# Patient Record
Sex: Female | Born: 1965 | Race: White | Hispanic: No | Marital: Married
Health system: Southern US, Community
[De-identification: ages and names within clinical notes are randomized; demographics above are authoritative.]

---

## 2020-07-03 ENCOUNTER — Other Ambulatory Visit: Payer: Self-pay

## 2020-07-03 ENCOUNTER — Emergency Department (HOSPITAL_COMMUNITY)
Admission: EM | Admit: 2020-07-03 | Discharge: 2020-07-03 | Disposition: A | Discharge status: Home / Self Care | Payer: Managed Care, Other (non HMO) | Attending: Emergency Medicine | Admitting: Emergency Medicine

## 2020-07-03 ENCOUNTER — Encounter (HOSPITAL_COMMUNITY): Payer: Self-pay | Admitting: Emergency Medicine

## 2020-07-03 ENCOUNTER — Emergency Department (HOSPITAL_COMMUNITY): Payer: Managed Care, Other (non HMO)

## 2020-07-03 DIAGNOSIS — R1110 Vomiting, unspecified: Secondary | ICD-10-CM | POA: Insufficient documentation

## 2020-07-03 DIAGNOSIS — R109 Unspecified abdominal pain: Secondary | ICD-10-CM | POA: Insufficient documentation

## 2020-07-03 DIAGNOSIS — N200 Calculus of kidney: Secondary | ICD-10-CM

## 2020-07-03 LAB — CBC
HCT: 35.8 % — ABNORMAL LOW (ref 36.0–46.0)
Hemoglobin: 11.9 g/dL — ABNORMAL LOW (ref 12.0–15.0)
MCH: 30.3 pg (ref 26.0–34.0)
MCHC: 33.2 g/dL (ref 30.0–36.0)
MCV: 91.1 fL (ref 80.0–100.0)
Platelets: 292 10*3/uL (ref 150–400)
RBC: 3.93 MIL/uL (ref 3.87–5.11)
RDW: 12.5 % (ref 11.5–15.5)
WBC: 9.8 10*3/uL (ref 4.0–10.5)
nRBC: 0 % (ref 0.0–0.2)

## 2020-07-03 LAB — URINALYSIS, ROUTINE W REFLEX MICROSCOPIC
Bacteria, UA: NONE SEEN
Bilirubin Urine: NEGATIVE
Glucose, UA: 50 mg/dL — AB
Ketones, ur: 20 mg/dL — AB
Leukocytes,Ua: NEGATIVE
Nitrite: NEGATIVE
Protein, ur: NEGATIVE mg/dL
RBC / HPF: >50 RBC/hpf — ABNORMAL HIGH (ref 0–5)
Specific Gravity, Urine: 1.013 (ref 1.005–1.030)
pH: 8 (ref 5.0–8.0)

## 2020-07-03 LAB — COMPREHENSIVE METABOLIC PANEL
ALT: 18 U/L (ref 0–44)
AST: 22 U/L (ref 15–41)
Albumin: 4.7 g/dL (ref 3.5–5.0)
Alkaline Phosphatase: 69 U/L (ref 38–126)
Anion gap: 10 (ref 5–15)
BUN: 20 mg/dL (ref 6–20)
CO2: 26 mmol/L (ref 22–32)
Calcium: 9.7 mg/dL (ref 8.9–10.3)
Chloride: 104 mmol/L (ref 98–111)
Creatinine, Ser: 1.17 mg/dL — ABNORMAL HIGH (ref 0.44–1.00)
GFR, Estimated: 55 mL/min — ABNORMAL LOW (ref >60)
Glucose, Bld: 137 mg/dL — ABNORMAL HIGH (ref 70–99)
Potassium: 3.7 mmol/L (ref 3.5–5.1)
Sodium: 140 mmol/L (ref 135–145)
Total Bilirubin: 0.5 mg/dL (ref 0.3–1.2)
Total Protein: 7.9 g/dL (ref 6.5–8.1)

## 2020-07-03 MED ORDER — SODIUM CHLORIDE 0.9 % IV SOLN: INTRAVENOUS | Status: DC

## 2020-07-03 MED ORDER — OXYCODONE-ACETAMINOPHEN 5-325 MG PO TABS: 1.0000 | ORAL_TABLET | Freq: Four times a day (QID) | ORAL | 0 refills | Status: DC | PRN

## 2020-07-03 MED ORDER — TAMSULOSIN HCL 0.4 MG PO CAPS: 0.4000 mg | ORAL_CAPSULE | Freq: Every day | ORAL | 0 refills | Status: AC

## 2020-07-03 MED ORDER — ONDANSETRON HCL 4 MG/2ML IJ SOLN
4.0000 mg | Freq: Once | INTRAMUSCULAR | Status: AC
  Administered 2020-07-03: 4 mg via INTRAVENOUS
  Filled 2020-07-03: qty 2

## 2020-07-03 MED ORDER — KETOROLAC TROMETHAMINE 30 MG/ML IJ SOLN
15.0000 mg | Freq: Once | INTRAMUSCULAR | Status: AC
  Administered 2020-07-03: 15 mg via INTRAVENOUS
  Filled 2020-07-03: qty 1

## 2020-07-03 MED ORDER — ONDANSETRON 8 MG PO TBDP: 8.0000 mg | ORAL_TABLET | Freq: Three times a day (TID) | ORAL | 0 refills | Status: AC | PRN

## 2020-07-03 MED ORDER — HYDROMORPHONE HCL 1 MG/ML IJ SOLN
1.0000 mg | Freq: Once | INTRAMUSCULAR | Status: AC
  Administered 2020-07-03: 1 mg via INTRAVENOUS
  Filled 2020-07-03: qty 1

## 2020-07-03 MED ORDER — TAMSULOSIN HCL 0.4 MG PO CAPS: 0.4000 mg | ORAL_CAPSULE | Freq: Every day | ORAL | 0 refills | Status: DC

## 2020-07-03 MED ORDER — ONDANSETRON 8 MG PO TBDP: 8.0000 mg | ORAL_TABLET | Freq: Three times a day (TID) | ORAL | 0 refills | Status: DC | PRN

## 2020-07-03 MED ORDER — OXYCODONE-ACETAMINOPHEN 5-325 MG PO TABS: 1.0000 | ORAL_TABLET | Freq: Four times a day (QID) | ORAL | 0 refills | Status: AC | PRN

## 2020-07-03 MED ORDER — OXYCODONE-ACETAMINOPHEN 5-325 MG PO TABS
1.0000 | ORAL_TABLET | Freq: Once | ORAL | Status: AC
  Administered 2020-07-03: 1 via ORAL
  Filled 2020-07-03: qty 1

## 2020-07-03 MED ORDER — ONDANSETRON 4 MG PO TBDP
4.0000 mg | ORAL_TABLET | Freq: Once | ORAL | Status: AC
  Administered 2020-07-03: 4 mg via ORAL
  Filled 2020-07-03: qty 1

## 2020-07-03 NOTE — ED Triage Notes (Signed)
Reports right flank pain with emesis since 0200 today. Hx of kidney stones. Denies dysuria or fevers.

## 2020-07-03 NOTE — ED Provider Notes (Signed)
Emergency Medicine Provider Triage Evaluation Note  Pamela Cannon , a 55 y.o. female  was evaluated in triage.  Pt complains of right-sided flank pain since 2 AM today.  Reports associated nausea and vomiting.  Denies any urinary symptoms or fevers.  Reports history of kidney stones and this feels similar.  No changes to bowel movements.  Review of Systems  Positive: Right-sided flank pain, vomiting Negative: Diarrhea  Physical Exam  BP (!) 161/98 (BP Location: Right Arm)   Pulse (!) 54   Resp 18   SpO2 97%  Gen:   Awake, no distress   Resp:  Normal effort  MSK:   Moves extremities without difficulty  Other:  Right-sided flank tenderness  Medical Decision Making  Medically screening exam initiated at 12:13 PM.  Appropriate orders placed.  Pamela Cannon was informed that the remainder of the evaluation will be completed by another provider, this initial triage assessment does not replace that evaluation, and the importance of remaining in the ED until their evaluation is complete.  Will order labs, urinalysis, CT   Dietrich Pates, PA-C 07/03/20 1214    Gerhard Munch, MD 07/04/20 2339

## 2020-07-03 NOTE — ED Provider Notes (Signed)
[facility]-EMERGENCY DEPT Provider Note   CSN: [id number] Arrival date & time: 07/03/20  1101     History Chief Complaint  Patient presents with  . Flank Pain    Pamela Cannon is a 55 y.o. female.  55 year old female with history kidney stones presents with cute onset of right-sided flank pain which began this morning.  Pain has been colicky.  Feels similar to her renal colic.  Denies any hematuria or dysuria.  No fever or chills.  Has had emesis due to the pain.  Denies any diarrhea.  No treatment use prior to arrival        No past medical history on file.  There are no problems to display for this patient.      OB History   No obstetric history on file.     No family history on file.     Home Medications Prior to Admission medications   Not on File    Allergies    Patient has no allergy information on record.  Review of Systems   Review of Systems  All other systems reviewed and are negative.   Physical Exam Updated Vital Signs BP (!) 181/98   Pulse (!) 54   Resp 16   SpO2 98%   Physical Exam Vitals and nursing note reviewed.  Constitutional:      General: She is not in acute distress.    Appearance: Normal appearance. She is well-developed. She is not toxic-appearing.  HENT:     Head: Normocephalic and atraumatic.  Eyes:     General: Lids are normal.     Conjunctiva/sclera: Conjunctivae normal.     Pupils: Pupils are equal, round, and reactive to light.  Neck:     Thyroid: No thyroid mass.     Trachea: No tracheal deviation.  Cardiovascular:     Rate and Rhythm: Normal rate and regular rhythm.     Heart sounds: Normal heart sounds. No murmur heard. No gallop.   Pulmonary:     Effort: Pulmonary effort is normal. No respiratory distress.     Breath sounds: Normal breath sounds. No stridor. No decreased breath sounds, wheezing, rhonchi or rales.  Abdominal:     General: Bowel sounds are normal. There is no  distension.     Palpations: Abdomen is soft.     Tenderness: There is no abdominal tenderness. There is right CVA tenderness. There is no rebound.  Musculoskeletal:        General: No tenderness. Normal range of motion.     Cervical back: Normal range of motion and neck supple.  Skin:    General: Skin is warm and dry.     Findings: No abrasion or rash.  Neurological:     Mental Status: She is alert and oriented to person, place, and time.     GCS: GCS eye subscore is 4. GCS verbal subscore is 5. GCS motor subscore is 6.     Cranial Nerves: No cranial nerve deficit.     Sensory: No sensory deficit.  Psychiatric:        Speech: Speech normal.        Behavior: Behavior normal.     ED Results / Procedures / Treatments   Labs (all labs ordered are listed, but only abnormal results are displayed) Labs Reviewed  COMPREHENSIVE METABOLIC PANEL - Abnormal; Notable for the following components:      Result Value   Glucose, Bld 137 (*)    Creatinine, Ser  1.17 (*)    GFR, Estimated 55 (*)    All other components within normal limits  CBC - Abnormal; Notable for the following components:   Hemoglobin 11.9 (*)    HCT 35.8 (*)    All other components within normal limits  URINALYSIS, ROUTINE W REFLEX MICROSCOPIC - Abnormal; Notable for the following components:   Color, Urine STRAW (*)    Glucose, UA 50 (*)    Hgb urine dipstick MODERATE (*)    Ketones, ur 20 (*)    RBC / HPF >50 (*)    All other components within normal limits  URINE CULTURE    EKG None  Radiology CT Renal Stone Study  Result Date: 07/03/2020 CLINICAL DATA:  Right flank pain with vomiting EXAM: CT ABDOMEN AND PELVIS WITHOUT CONTRAST TECHNIQUE: Multidetector CT imaging of the abdomen and pelvis was performed following the standard protocol without oral or IV contrast. COMPARISON:  None. FINDINGS: Lower chest: There is slight bibasilar atelectasis. Lung bases otherwise are clear. There is a small hiatal hernia.  Hepatobiliary: No focal liver lesions are appreciable on this noncontrast enhanced study. Gallbladder wall is not appreciably thickened. There is no biliary duct dilatation. Pancreas: There is no pancreatic mass or inflammatory focus. Spleen: No splenic lesions are evident. Adrenals/Urinary Tract: Adrenals bilaterally appear normal. Right kidney is edematous. There is a cyst in the mid right kidney measuring 9 x 9 mm. There is moderately severe hydronephrosis on the right. There is no appreciable hydronephrosis on the left. There is a 2 mm calculus in the mid left kidney. There is a tiny calculus in the mid right kidney. There is right ureteral enlargement to the level of the distal right ureter. There is a calculus in the distal right ureter near the ureterovesical junction measuring 5 x 3 mm. No other ureteral calculi evident. There are several phleboliths in the pelvis near but separate from the distal ureters. Urinary bladder is midline with wall thickness within normal limits. Stomach/Bowel: There is no appreciable bowel wall or mesenteric thickening. There is no appreciable bowel obstruction. Terminal ileum appears unremarkable. No appendiceal region inflammation evident. No free air or portal venous air. Vascular/Lymphatic: No abdominal aortic aneurysm. Minimal calcification noted in portions of the aorta and right common iliac artery. There is no evident adenopathy in the abdomen or pelvis. Reproductive: Uterus is anteverted. No uterine or adnexal masses are appreciable. Other: No appreciable abscess or ascites in the abdomen pelvis. There is slight fat in the umbilicus. 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 No blastic or lytic bone lesions are evident. There is no intramuscular lesion. IMPRESSION: 1. There is a 5 x 3 mm calculus near the ureterovesical junction in the distal right ureter causing moderately severe hydronephrosis and ureterectasis on the  right. Right kidney is edematous. 2.  Small nonobstructing calculi in each kidney. 3. No bowel wall thickening or bowel obstruction. No abscess in the abdomen or pelvis. No appendiceal region inflammation. 4.  Small hiatal hernia. Electronically Signed   By: Bretta Bang III M.D.   On: 07/03/2020 13:31    Procedures Procedures   Medications Ordered in ED Medications  0.9 %  sodium chloride infusion (has no administration in time range)  HYDROmorphone (DILAUDID) injection 1 mg (has no administration in time range)  ondansetron (ZOFRAN) injection 4 mg (has no administration in time range)  ketorolac (TORADOL) 30 MG/ML injection 15 mg (has no administration in time range)  oxyCODONE-acetaminophen (  PERCOCET/ROXICET) 5-325 MG per tablet 1 tablet (1 tablet Oral Given 07/03/20 1237)  ondansetron (ZOFRAN-ODT) disintegrating tablet 4 mg (4 mg Oral Given 07/03/20 1238)    ED Course  I have reviewed the triage vital signs and the nursing notes.  Pertinent labs & imaging results that were available during my care of the patient were reviewed by me and considered in my medical decision making (see chart for details).    MDM Rules/Calculators/A&P                          Patient medicated for pain here.  Renal CT results noted.  Pain is controlled.  Will prescribe Flomax, analgesics. Final Clinical Impression(s) / ED Diagnoses Final diagnoses:  None    Rx / DC Orders ED Discharge Orders    None       Lorre Nick, MD 07/03/20 1535

## 2020-07-04 LAB — URINE CULTURE: Culture: NO GROWTH

## 2022-06-18 IMAGING — CT CT RENAL STONE PROTOCOL
2 of 4 series · 15 of 46 positions shown, 17 images · non-contrast
Comparison: None.

CLINICAL DATA: Right flank pain with vomiting

EXAM:
CT ABDOMEN AND PELVIS WITHOUT CONTRAST
TECHNIQUE: Multidetector CT imaging of the abdomen and pelvis was performed
following the standard protocol without oral or IV contrast.

[Series 2: axial st · axial · 0.80mm/px · z∈[-334,+60]mm · 12 of 89 slices shown, 14 images]
[im 5/89  soft-tissue]
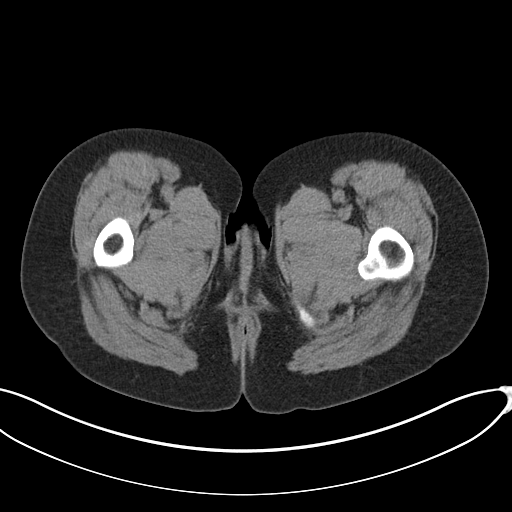
[im 5/89  bone]
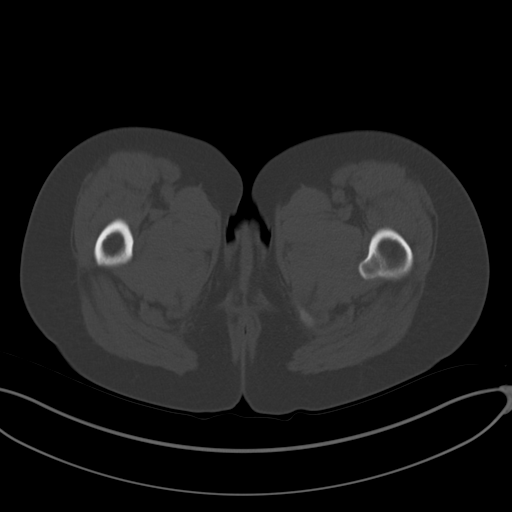
[im 15/89  soft-tissue]
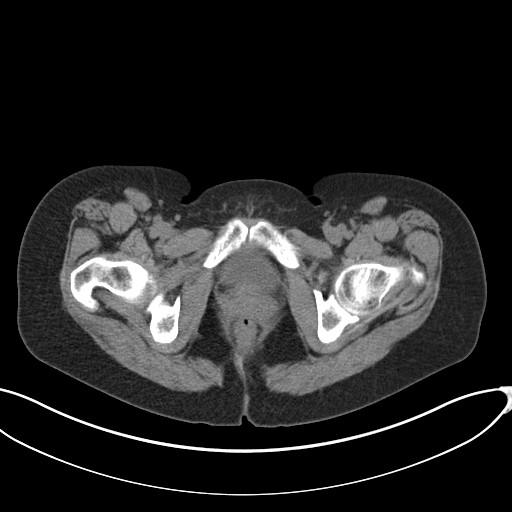
[im 20/89  soft-tissue]
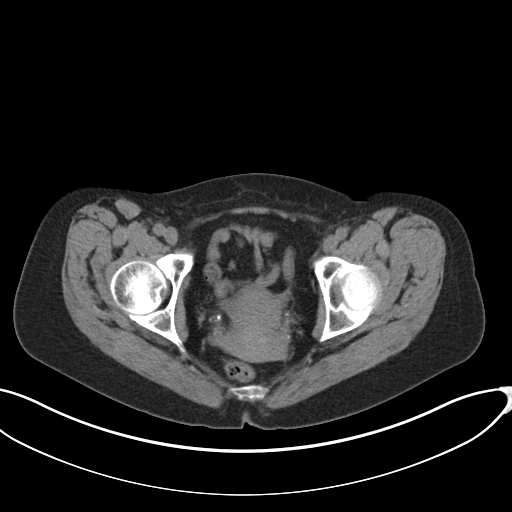
[im 25/89  soft-tissue]
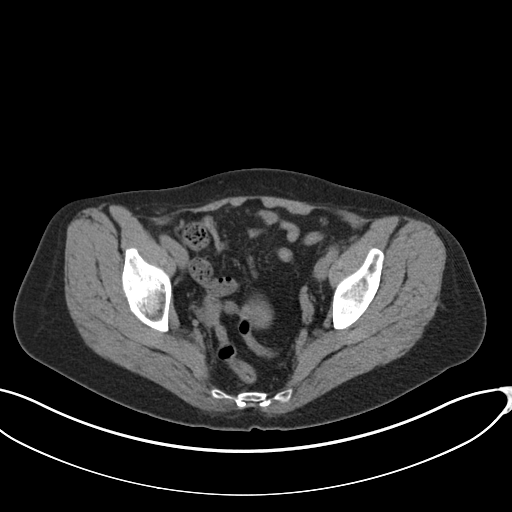
[im 35/89  soft-tissue]
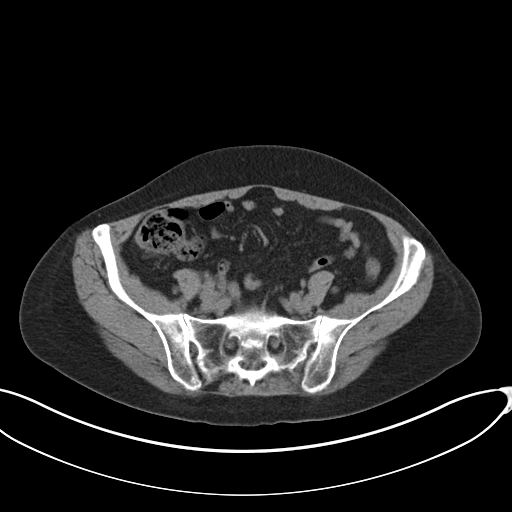
[im 40/89  soft-tissue]
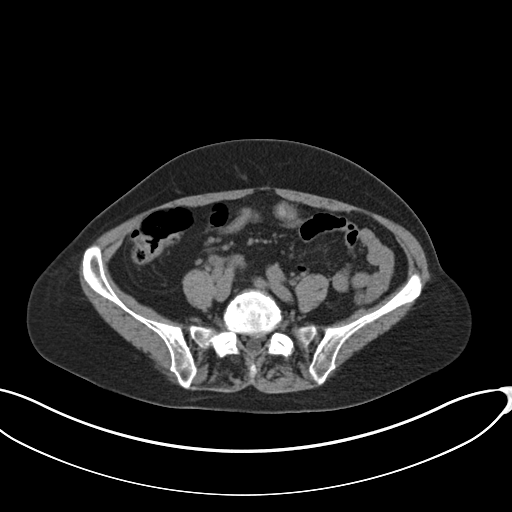
[im 49/89  soft-tissue]
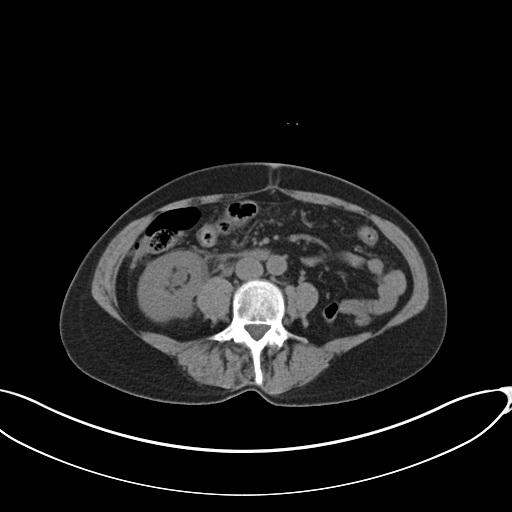
[im 54/89  soft-tissue]
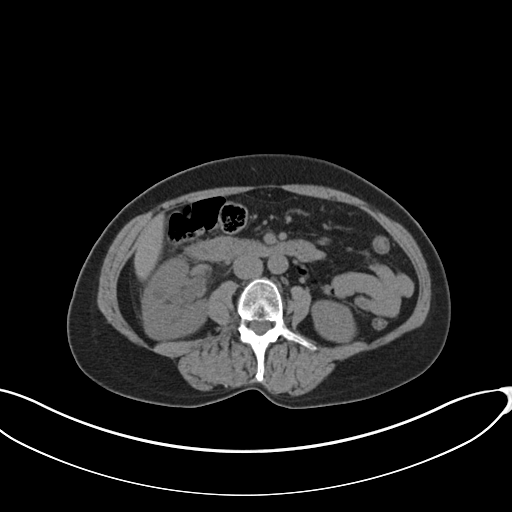
[im 64/89  soft-tissue]
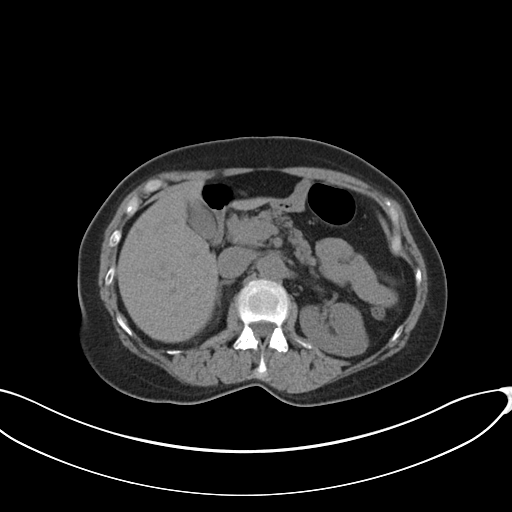
[im 64/89  bone]
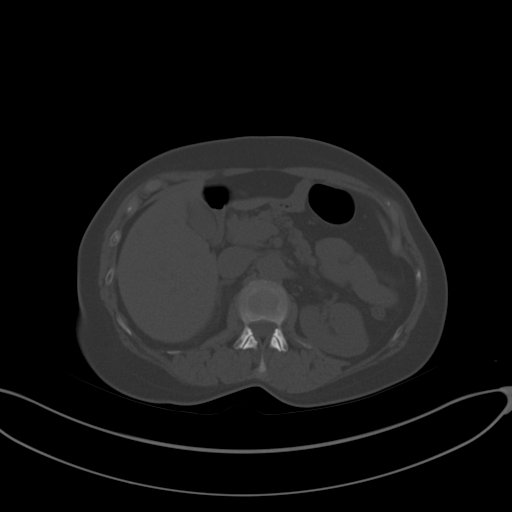
[im 69/89  soft-tissue]
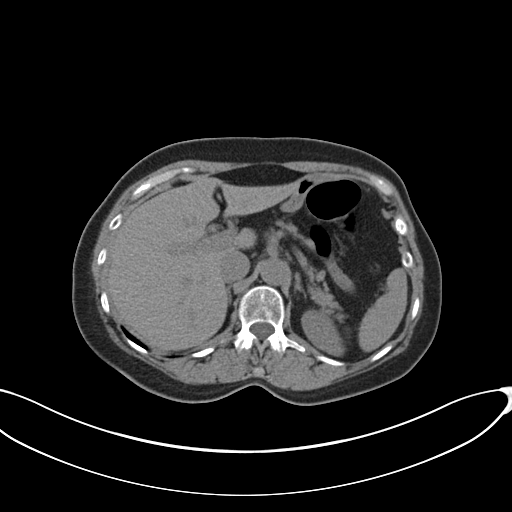
[im 74/89  soft-tissue]
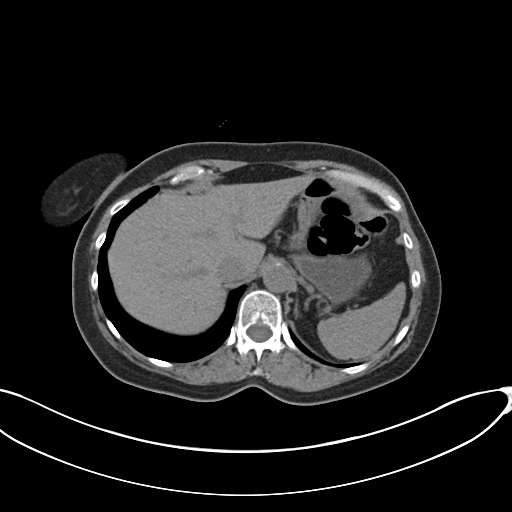
[im 84/89  soft-tissue]
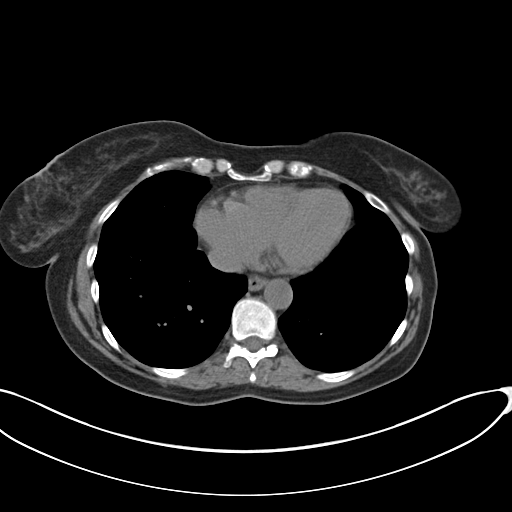

[Series 5: coronal · coronal · 0.68mm/px · 3 of 115 slices shown]
[im 39/115  soft-tissue]
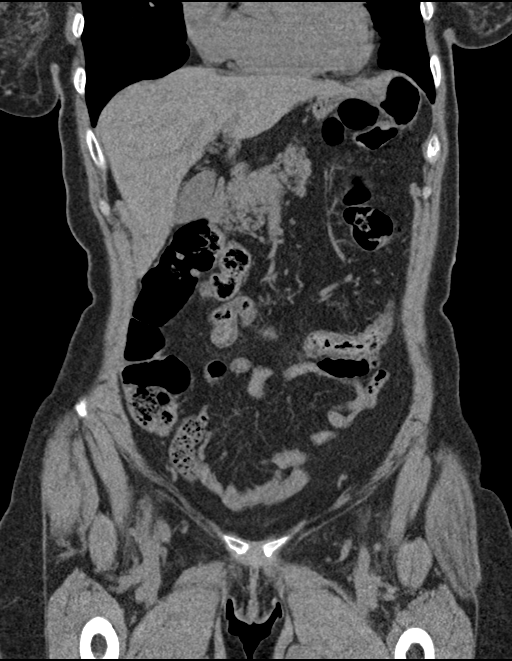
[im 51/115  soft-tissue]
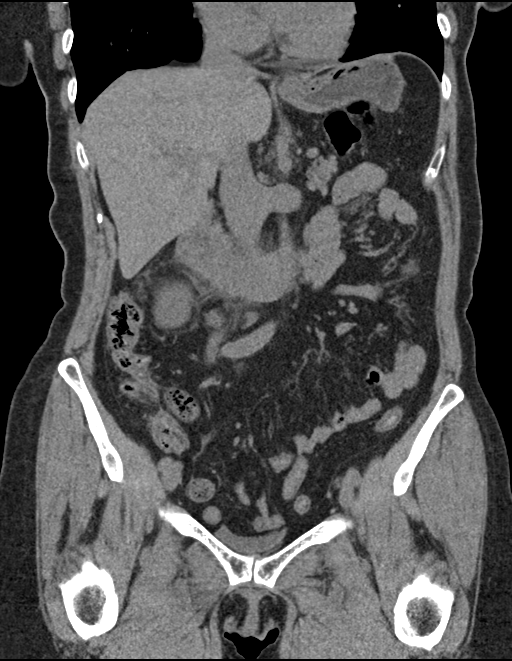
[im 64/115  soft-tissue]
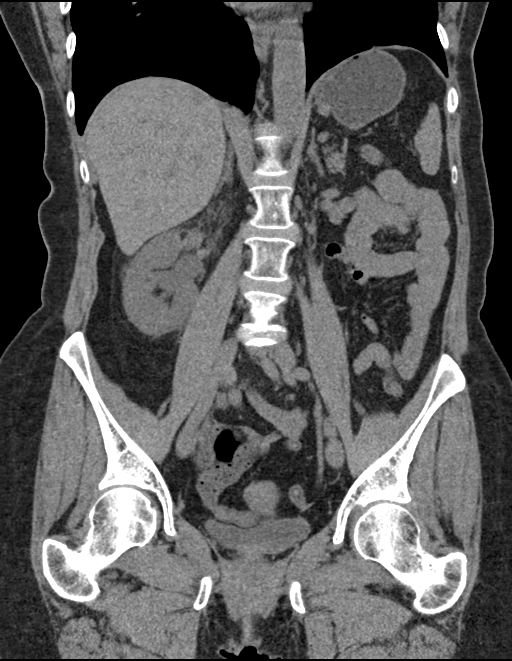

[15 of 46 positions shown; findings below may reference images not displayed]

FINDINGS: Lower chest: There is slight bibasilar atelectasis. Lung bases
otherwise are clear. There is a small hiatal hernia.

Hepatobiliary: No focal liver lesions are appreciable on this
noncontrast enhanced study. Gallbladder wall is not appreciably
thickened. There is no biliary duct dilatation.

Pancreas: There is no pancreatic mass or inflammatory focus.

Spleen: No splenic lesions are evident.

Adrenals/Urinary Tract: Adrenals bilaterally appear normal. Right
kidney is edematous. There is a cyst in the mid right kidney
measuring 9 x 9 mm. There is moderately severe hydronephrosis on the
right. There is no appreciable hydronephrosis on the left. There is
a 2 mm calculus in the mid left kidney. There is a tiny calculus in
the mid right kidney. There is right ureteral enlargement to the
level of the distal right ureter. There is a calculus in the distal
right ureter near the ureterovesical junction measuring 5 x 3 mm. No
other ureteral calculi evident. There are several phleboliths in the
pelvis near but separate from the distal ureters. Urinary bladder is
midline with wall thickness within normal limits.

Stomach/Bowel: There is no appreciable bowel wall or mesenteric
thickening. There is no appreciable bowel obstruction. Terminal
ileum appears unremarkable. No appendiceal region inflammation
evident. No free air or portal venous air.

Vascular/Lymphatic: No abdominal aortic aneurysm. Minimal
calcification noted in portions of the aorta and right common iliac
artery. There is no evident adenopathy in the abdomen or pelvis.

Reproductive: Uterus is anteverted. No uterine or adnexal masses are
appreciable.

Other: No appreciable abscess or ascites in the abdomen pelvis.
There is slight fat in the umbilicus.

Musculoskeletal: There is degenerative change in the lumbar spine,
most severe at L4-5. No blastic or lytic bone lesions are evident.
There is no intramuscular lesion.
IMPRESSION: 1. There is a 5 x 3 mm calculus near the ureterovesical junction in
the distal right ureter causing moderately severe hydronephrosis and
ureterectasis on the right. Right kidney is edematous.

2.  Small nonobstructing calculi in each kidney.

3. No bowel wall thickening or bowel obstruction. No abscess in the
abdomen or pelvis. No appendiceal region inflammation.

4.  Small hiatal hernia.
